# Patient Record
Sex: Male | Born: 1970 | Race: Black or African American | Hispanic: No | Marital: Single | Smoking: Former smoker
Health system: Southern US, Community
[De-identification: ages and names within clinical notes are randomized; demographics above are authoritative.]

## PROBLEM LIST (undated history)

## (undated) DIAGNOSIS — E119 Type 2 diabetes mellitus without complications: Secondary | ICD-10-CM

## (undated) DIAGNOSIS — K259 Gastric ulcer, unspecified as acute or chronic, without hemorrhage or perforation: Secondary | ICD-10-CM

## (undated) HISTORY — PX: APPENDECTOMY: SHX54

---

## 2002-10-03 ENCOUNTER — Emergency Department (HOSPITAL_COMMUNITY): Admission: EM | Admit: 2002-10-03 | Discharge: 2002-10-03 | Payer: Self-pay | Admitting: Emergency Medicine

## 2002-12-29 ENCOUNTER — Emergency Department (HOSPITAL_COMMUNITY): Admission: EM | Admit: 2002-12-29 | Discharge: 2002-12-29 | Payer: Self-pay | Admitting: Emergency Medicine

## 2003-03-08 ENCOUNTER — Emergency Department (HOSPITAL_COMMUNITY): Admission: EM | Admit: 2003-03-08 | Discharge: 2003-03-08 | Payer: Self-pay | Admitting: Emergency Medicine

## 2015-09-28 ENCOUNTER — Other Ambulatory Visit: Payer: Self-pay | Admitting: Family Medicine

## 2015-09-28 DIAGNOSIS — L720 Epidermal cyst: Secondary | ICD-10-CM

## 2015-10-02 ENCOUNTER — Ambulatory Visit
Admission: RE | Admit: 2015-10-02 | Discharge: 2015-10-02 | Disposition: A | Discharge status: Home / Self Care | Payer: Managed Care, Other (non HMO) | Source: Ambulatory Visit | Attending: Family Medicine | Admitting: Family Medicine

## 2015-10-02 ENCOUNTER — Other Ambulatory Visit: Payer: Self-pay | Admitting: Family Medicine

## 2015-10-02 DIAGNOSIS — L720 Epidermal cyst: Secondary | ICD-10-CM

## 2015-10-03 ENCOUNTER — Emergency Department (HOSPITAL_COMMUNITY)
Admission: EM | Admit: 2015-10-03 | Discharge: 2015-10-03 | Disposition: A | Discharge status: Home / Self Care | Payer: Managed Care, Other (non HMO) | Attending: Emergency Medicine | Admitting: Emergency Medicine

## 2015-10-03 ENCOUNTER — Encounter (HOSPITAL_COMMUNITY): Payer: Self-pay

## 2015-10-03 DIAGNOSIS — I889 Nonspecific lymphadenitis, unspecified: Secondary | ICD-10-CM | POA: Insufficient documentation

## 2015-10-03 DIAGNOSIS — Z87891 Personal history of nicotine dependence: Secondary | ICD-10-CM | POA: Diagnosis not present

## 2015-10-03 DIAGNOSIS — N509 Disorder of male genital organs, unspecified: Secondary | ICD-10-CM | POA: Insufficient documentation

## 2015-10-03 DIAGNOSIS — R591 Generalized enlarged lymph nodes: Secondary | ICD-10-CM | POA: Diagnosis present

## 2015-10-03 DIAGNOSIS — N5089 Other specified disorders of the male genital organs: Secondary | ICD-10-CM

## 2015-10-03 LAB — CBC WITH DIFFERENTIAL/PLATELET
Basophils Absolute: 0 10*3/uL (ref 0.0–0.1)
Basophils Relative: 0 %
EOS ABS: 0.3 10*3/uL (ref 0.0–0.7)
EOS PCT: 3 %
HCT: 44 % (ref 39.0–52.0)
Hemoglobin: 14.5 g/dL (ref 13.0–17.0)
LYMPHS PCT: 33 %
Lymphs Abs: 2.5 10*3/uL (ref 0.7–4.0)
MCH: 29.5 pg (ref 26.0–34.0)
MCHC: 33 g/dL (ref 30.0–36.0)
MCV: 89.4 fL (ref 78.0–100.0)
MONO ABS: 0.8 10*3/uL (ref 0.1–1.0)
Monocytes Relative: 11 %
Neutro Abs: 4.1 10*3/uL (ref 1.7–7.7)
Neutrophils Relative %: 53 %
PLATELETS: 256 10*3/uL (ref 150–400)
RBC: 4.92 MIL/uL (ref 4.22–5.81)
RDW: 12.2 % (ref 11.5–15.5)
WBC: 7.8 10*3/uL (ref 4.0–10.5)

## 2015-10-03 LAB — COMPREHENSIVE METABOLIC PANEL
ALT: 20 U/L (ref 17–63)
ANION GAP: 6 (ref 5–15)
AST: 22 U/L (ref 15–41)
Albumin: 4.2 g/dL (ref 3.5–5.0)
Alkaline Phosphatase: 38 U/L (ref 38–126)
BUN: 17 mg/dL (ref 6–20)
CHLORIDE: 104 mmol/L (ref 101–111)
CO2: 28 mmol/L (ref 22–32)
Calcium: 9.3 mg/dL (ref 8.9–10.3)
Creatinine, Ser: 1.29 mg/dL — ABNORMAL HIGH (ref 0.61–1.24)
Glucose, Bld: 98 mg/dL (ref 65–99)
POTASSIUM: 4 mmol/L (ref 3.5–5.1)
Sodium: 138 mmol/L (ref 135–145)
Total Bilirubin: 0.6 mg/dL (ref 0.3–1.2)
Total Protein: 7.4 g/dL (ref 6.5–8.1)

## 2015-10-03 MED ORDER — DOXYCYCLINE HYCLATE 100 MG PO TABS
100.0000 mg | ORAL_TABLET | Freq: Once | ORAL | Status: AC
  Administered 2015-10-03: 100 mg via ORAL
  Filled 2015-10-03: qty 1

## 2015-10-03 MED ORDER — HYDROCODONE-ACETAMINOPHEN 5-325 MG PO TABS: 2.0000 | ORAL_TABLET | ORAL | Status: DC | PRN

## 2015-10-03 MED ORDER — HYDROCODONE-ACETAMINOPHEN 5-325 MG PO TABS
1.0000 | ORAL_TABLET | Freq: Once | ORAL | Status: AC
  Administered 2015-10-03: 1 via ORAL
  Filled 2015-10-03: qty 1

## 2015-10-03 MED ORDER — DOXYCYCLINE HYCLATE 100 MG PO CAPS: 100.0000 mg | ORAL_CAPSULE | Freq: Two times a day (BID) | ORAL | Status: DC

## 2015-10-03 NOTE — Discharge Instructions (Signed)
It is recommended that you follow-up with a urologist to discuss any further treatment including possibility of removal of the lymph node if not improving.   Lymphadenopathy Lymphadenopathy refers to swollen or enlarged lymph glands, also called lymph nodes. Lymph glands are part of your body's defense (immune) system, which protects the body from infections, germs, and diseases. Lymph glands are found in many locations in your body, including the neck, underarm, and groin.  Many things can cause lymph glands to become enlarged. When your immune system responds to germs, such as viruses or bacteria, infection-fighting cells and fluid build up. This causes the glands to grow in size. Usually, this is not something to worry about. The swelling and any soreness often go away without treatment. However, swollen lymph glands can also be caused by a number of diseases. Your health care provider may do various tests to help determine the cause. If the cause of your swollen lymph glands cannot be found, it is important to monitor your condition to make sure the swelling goes away. HOME CARE INSTRUCTIONS Watch your condition for any changes. The following actions may help to lessen any discomfort you are feeling:  Get plenty of rest.  Take medicines only as directed by your health care provider. Your health care provider may recommend over-the-counter medicines for pain.  Apply moist heat compresses to the site of swollen lymph nodes as directed by your health care provider. This can help reduce any pain.  Check your lymph nodes daily for any changes.  Keep all follow-up visits as directed by your health care provider. This is important. SEEK MEDICAL CARE IF:  Your lymph nodes are still swollen after 2 weeks.  Your swelling increases or spreads to other areas.  Your lymph nodes are hard, seem fixed to the skin, or are growing rapidly.  Your skin over the lymph nodes is red and inflamed.  You have  a fever.  You have chills.  You have fatigue.  You develop a sore throat.  You have abdominal pain.  You have weight loss.  You have night sweats. SEEK IMMEDIATE MEDICAL CARE IF:  You notice fluid leaking from the area of the enlarged lymph node.  You have severe pain in any area of your body.  You have chest pain.  You have shortness of breath.   This information is not intended to replace advice given to you by your health care provider. Make sure you discuss any questions you have with your health care provider.   Document Released: 01/22/2008 Document Revised: 05/05/2014 Document Reviewed: 11/17/2013 Elsevier Interactive Patient Education [employer]2016 Elsevier Inc.

## 2015-10-03 NOTE — ED Notes (Signed)
Pt c/o increasing "bump" in L groin.  Pain score 10/10.  Pt reports taking OTC medication w/o relief.  Pt was recently seen by PCP for same and had an US completed yesterday.  Per chart review, US resulted necrotic lymph node in L groin.  Pt is unaware of result.

## 2015-10-03 NOTE — ED Provider Notes (Signed)
MSE was initiated and I personally evaluated the patient and placed orders (if any) at  5:16 PM on October 03, 2015.  The patient appears stable so that the remainder of the MSE may be completed by another provider.  Small area of left scrotal tenderness and swelling  Azalia BilisKevin Hennie Gosa, MD 10/03/15 [phone]

## 2015-10-03 NOTE — ED Provider Notes (Signed)
CSN: [id number]     Arrival date & time 10/03/15  1654 History   First MD Initiated Contact with Patient 10/03/15 1712     Chief Complaint  Patient presents with  . Lymphadenopathy      HPI  Patient presents for evaluation of scrotal pain. Had a swollen area in his left lower scrotum for the last year.  Coming more painful and swollen about a week ago. Outpatient ultrasound obtained today he presents here. No history of STDs. No history of genital sores. No testicular pain or swelling. No additional areas of lymphadenopathy in his inguinal region. No dysuria.  History reviewed. No pertinent past medical history. Past Surgical History  Procedure Laterality Date  . Appendectomy     History reviewed. No pertinent family history. Social History  Substance Use Topics  . Smoking status: Former Games developer  . Smokeless tobacco: None  . Alcohol Use: Yes    Review of Systems  Constitutional: Negative for fever, chills, diaphoresis, appetite change and fatigue.  HENT: Negative for mouth sores, sore throat and trouble swallowing.   Eyes: Negative for visual disturbance.  Respiratory: Negative for cough, chest tightness, shortness of breath and wheezing.   Cardiovascular: Negative for chest pain.  Gastrointestinal: Negative for nausea, vomiting, abdominal pain, diarrhea and abdominal distention.  Endocrine: Negative for polydipsia, polyphagia and polyuria.  Genitourinary: Positive for scrotal swelling. Negative for dysuria, frequency and hematuria.  Musculoskeletal: Negative for gait problem.  Skin: Negative for color change, pallor and rash.  Neurological: Negative for dizziness, syncope, light-headedness and headaches.  Hematological: Does not bruise/bleed easily.  Psychiatric/Behavioral: Negative for behavioral problems and confusion.      Allergies  Sulfa antibiotics  Home Medications   Prior to Admission medications   Medication Sig Start Date End Date Taking? Authorizing  Provider  acetaminophen (TYLENOL) 500 MG tablet Take 500 mg by mouth every 6 (six) hours as needed for moderate pain.   Yes Historical Provider, MD  doxycycline (VIBRAMYCIN) 100 MG capsule Take 1 capsule (100 mg total) by mouth 2 (two) times daily. 10/03/15   Rolland Porter, MD  HYDROcodone-acetaminophen (NORCO/VICODIN) 5-325 MG tablet Take 2 tablets by mouth every 4 (four) hours as needed. 10/03/15   Rolland Porter, MD   BP 126/79 mmHg  Pulse 53  Temp(Src) 98.5 F (36.9 C) (Oral)  Resp 16  SpO2 99% Physical Exam  Constitutional: He is oriented to person, place, and time. He appears well-developed and well-nourished. No distress.  HENT:  Head: Normocephalic.  Eyes: Conjunctivae are normal. Pupils are equal, round, and reactive to light. No scleral icterus.  Neck: Normal range of motion. Neck supple. No thyromegaly present.  Cardiovascular: Normal rate and regular rhythm.  Exam reveals no gallop and no friction rub.   No murmur heard. Pulmonary/Chest: Effort normal and breath sounds normal. No respiratory distress. He has no wheezes. He has no rales.  Abdominal: Soft. Bowel sounds are normal. He exhibits no distension. There is no tenderness. There is no rebound.  Genitourinary:     Musculoskeletal: Normal range of motion.  Neurological: He is alert and oriented to person, place, and time.  Skin: Skin is warm and dry. No rash noted.  Psychiatric: He has a normal mood and affect. His behavior is normal.    ED Course  Procedures (including critical care time) Labs Review Labs Reviewed  COMPREHENSIVE METABOLIC PANEL - Abnormal; Notable for the following:    Creatinine, Ser 1.29 (*)    All other components within normal limits  CBC WITH DIFFERENTIAL/PLATELET    Imaging Review [place] Pelvis Limited  10/02/2015  CLINICAL DATA:  Palpable area in the left inguinal region for 2 years, possibly increasing in size area EXAM: LIMITED ULTRASOUND OF PELVIS TECHNIQUE: Limited transabdominal ultrasound  examination of the pelvis was performed. COMPARISON:  None. FINDINGS: Ultrasound over the palpable area in the left groin was performed. Very superficial within the left groin, 4 mm below the skin surface, there is an inhomogeneous oval structure present followup 1.5 x 0.7 x 1.1 cm. The oval configuration may indicate a lymph node, although there is little if any blood flow within this lesion. A necrotic lymph node would be a consideration. This area does not appear to be typical of lipoma by ultrasound. Clinical followup is recommended. IMPRESSION: Possible necrotic lymph node within the left groin. This does not appear to represent a lipoma. Correlate and followup clinically. Electronically Signed   By: Dwyane DeePaul  Barry M.D.   On: 10/02/2015 13:36   [place] Abdomen Limited  10/02/2015  CLINICAL DATA:  Soft tissue palpable areas in the left flank EXAM: LIMITED ABDOMINAL ULTRASOUND COMPARISON:  None. FINDINGS: Ultrasound over the left flank and left mid lower back was performed at the site of the palpable abnormalities. The palpable abdomen in the left lower back is echogenic and oval most consistent with a lipoma by ultrasound. No internal blood flow is seen. The area within the left flank is more inhomogeneous but also somewhat echogenic. This most likely represents an atypical lipoma, with no internal blood flow, but clinical followup is recommended. IMPRESSION: 1. The area in the left mid lower back is most typical of lipoma by ultrasound. 2. The area in the left flank is inhomogeneous but somewhat echogenic, also probably right representing an atypical lipoma. Correlate clinically and recommend continued followup. Electronically Signed   By: Dwyane DeePaul  Barry M.D.   On: 10/02/2015 13:33   I have personally reviewed and evaluated these images and lab results as part of my medical decision-making.   EKG Interpretation None      MDM   Final diagnoses:  Scrotal mass  Lymphadenitis    Ultrasound shows 2 lipomas in  his back. Scrotal ultrasound shows normal testicle. Shows a poorly vascularized area consistent with probable degenerating lymph node. No fluid collection to suggest abscess. I've given him urology referral. Doxycycline 10 days. Advised that he follow up with urology to insure this requires no additional treatment for his labs are reassuring.    Rolland PorterMark Naiah Donahoe, MD 10/03/15 2048

## 2015-10-05 ENCOUNTER — Other Ambulatory Visit: Payer: Self-pay

## 2016-02-05 ENCOUNTER — Emergency Department (HOSPITAL_COMMUNITY)
Admission: EM | Admit: 2016-02-05 | Discharge: 2016-02-05 | Disposition: A | Discharge status: Home / Self Care | Payer: Managed Care, Other (non HMO) | Attending: Emergency Medicine | Admitting: Emergency Medicine

## 2016-02-05 ENCOUNTER — Encounter (HOSPITAL_COMMUNITY): Payer: Self-pay | Admitting: Emergency Medicine

## 2016-02-05 ENCOUNTER — Emergency Department (HOSPITAL_COMMUNITY): Payer: Managed Care, Other (non HMO)

## 2016-02-05 DIAGNOSIS — Z87891 Personal history of nicotine dependence: Secondary | ICD-10-CM | POA: Insufficient documentation

## 2016-02-05 DIAGNOSIS — E119 Type 2 diabetes mellitus without complications: Secondary | ICD-10-CM | POA: Insufficient documentation

## 2016-02-05 DIAGNOSIS — J209 Acute bronchitis, unspecified: Secondary | ICD-10-CM

## 2016-02-05 HISTORY — DX: Type 2 diabetes mellitus without complications: E11.9

## 2016-02-05 LAB — BASIC METABOLIC PANEL
ANION GAP: 10 (ref 5–15)
BUN: 13 mg/dL (ref 6–20)
CALCIUM: 9.1 mg/dL (ref 8.9–10.3)
CO2: 26 mmol/L (ref 22–32)
Chloride: 102 mmol/L (ref 101–111)
Creatinine, Ser: 1.51 mg/dL — ABNORMAL HIGH (ref 0.61–1.24)
GFR calc Af Amer: >60 mL/min (ref >60)
GFR, EST NON AFRICAN AMERICAN: 55 mL/min — AB (ref >60)
GLUCOSE: 94 mg/dL (ref 65–99)
Potassium: 3.2 mmol/L — ABNORMAL LOW (ref 3.5–5.1)
SODIUM: 138 mmol/L (ref 135–145)

## 2016-02-05 LAB — CBC
HCT: 43.9 % (ref 39.0–52.0)
HEMOGLOBIN: 14.4 g/dL (ref 13.0–17.0)
MCH: 30.1 pg (ref 26.0–34.0)
MCHC: 32.8 g/dL (ref 30.0–36.0)
MCV: 91.8 fL (ref 78.0–100.0)
Platelets: 229 10*3/uL (ref 150–400)
RBC: 4.78 MIL/uL (ref 4.22–5.81)
RDW: 12.3 % (ref 11.5–15.5)
WBC: 8.4 10*3/uL (ref 4.0–10.5)

## 2016-02-05 LAB — I-STAT TROPONIN, ED: TROPONIN I, POC: 0 ng/mL (ref 0.00–0.08)

## 2016-02-05 MED ORDER — AEROCHAMBER PLUS FLO-VU MEDIUM MISC
1.0000 | Freq: Once | Status: DC
  Filled 2016-02-05: qty 1

## 2016-02-05 MED ORDER — PROMETHAZINE-DM 6.25-15 MG/5ML PO SYRP: 5.0000 mL | ORAL_SOLUTION | Freq: Four times a day (QID) | ORAL | 0 refills | Status: DC | PRN

## 2016-02-05 MED ORDER — PREDNISONE 20 MG PO TABS
60.0000 mg | ORAL_TABLET | Freq: Once | ORAL | Status: AC
  Administered 2016-02-05: 60 mg via ORAL
  Filled 2016-02-05: qty 3

## 2016-02-05 MED ORDER — ALBUTEROL SULFATE (2.5 MG/3ML) 0.083% IN NEBU
5.0000 mg | INHALATION_SOLUTION | Freq: Once | RESPIRATORY_TRACT | Status: AC
  Administered 2016-02-05: 5 mg via RESPIRATORY_TRACT
  Filled 2016-02-05: qty 6

## 2016-02-05 MED ORDER — ALBUTEROL SULFATE HFA 108 (90 BASE) MCG/ACT IN AERS
2.0000 | INHALATION_SPRAY | Freq: Four times a day (QID) | RESPIRATORY_TRACT | Status: DC
Start: 2016-02-05 — End: 2016-02-05
  Administered 2016-02-05: 2 via RESPIRATORY_TRACT
  Filled 2016-02-05: qty 6.7

## 2016-02-05 MED ORDER — ALBUTEROL SULFATE (2.5 MG/3ML) 0.083% IN NEBU
5.0000 mg | INHALATION_SOLUTION | Freq: Once | RESPIRATORY_TRACT | Status: AC
Start: 2016-02-05 — End: 2016-02-05
  Administered 2016-02-05: 5 mg via RESPIRATORY_TRACT
  Filled 2016-02-05: qty 6

## 2016-02-05 MED ORDER — IPRATROPIUM BROMIDE 0.02 % IN SOLN
0.5000 mg | RESPIRATORY_TRACT | Status: AC
  Administered 2016-02-05: 0.5 mg via RESPIRATORY_TRACT
  Filled 2016-02-05: qty 2.5

## 2016-02-05 MED ORDER — SODIUM CHLORIDE 0.9 % IV BOLUS (SEPSIS)
1000.0000 mL | Freq: Once | INTRAVENOUS | Status: AC
  Administered 2016-02-05: 1000 mL via INTRAVENOUS

## 2016-02-05 MED ORDER — GUAIFENESIN ER 1200 MG PO TB12: 1.0000 | ORAL_TABLET | Freq: Two times a day (BID) | ORAL | 0 refills | Status: DC

## 2016-02-05 MED ORDER — PREDNISONE 50 MG PO TABS: 50.0000 mg | ORAL_TABLET | Freq: Every day | ORAL | 0 refills | Status: DC

## 2016-02-05 NOTE — Discharge Instructions (Signed)
Return here as needed.  Increase your fluid intake, rest as much possible.  Follow-up with a primary care doctor

## 2016-02-05 NOTE — ED Triage Notes (Signed)
Pt reports having a cold for the past two months that he hasn't been able to get rid of, despite taking OTC remedies. Yesterday, began experiencing chest tightness and incr'd dyspnea. Denies hx of asthma or other respiratory issues. Took partner's Proair with little to no relief. Denies fever or present N/V/D, though reports one episode of emesis prior.

## 2016-02-05 NOTE — ED Provider Notes (Signed)
[facility]EMER[facility]Y DEPT Provider Note   CSN: [id number] Arrival date & time: 02/05/16  0535     History   Chief Complaint Chief Complaint  Patient presents with  . Chest Pain  . Cough    HPI Peter Ballard is a 45 y.o. male.  HPI Patient presents to the emergency department with cough over the last month.  The patient states that he has also had some chest discomfort when he coughs.  Patient states that nothing seems to make the condition better or worse.  Patient states he is not a smokerThe patient denies  shortness of breath, headache,blurred vision, neck pain, fever, weakness, numbness, dizziness, anorexia, edema, abdominal pain, nausea, vomiting, diarrhea, rash, back pain, dysuria, hematemesis, bloody stool, near syncope, or syncope. Past Medical History:  Diagnosis Date  . Diabetes mellitus without complication (HCC)     There are no active problems to display for this patient.   Past Surgical History:  Procedure Laterality Date  . APPENDECTOMY         Home Medications    Prior to Admission medications   Medication Sig Start Date End Date Taking? Authorizing Provider  doxycycline (VIBRAMYCIN) 100 MG capsule Take 1 capsule (100 mg total) by mouth 2 (two) times daily. Patient not taking: Reported on 02/05/2016 10/03/15   Rolland Porter, MD  HYDROcodone-acetaminophen (NORCO/VICODIN) 5-325 MG tablet Take 2 tablets by mouth every 4 (four) hours as needed. Patient not taking: Reported on 02/05/2016 10/03/15   Rolland Porter, MD    Family History History reviewed. No pertinent family history.  Social History Social History  Substance Use Topics  . Smoking status: Former Games developer  . Smokeless tobacco: Never Used  . Alcohol use Yes     Allergies   Sulfa antibiotics   Review of Systems Review of Systems All other systems negative except as documented in the HPI. All pertinent positives and negatives as reviewed in the HPI.  Physical Exam Updated Vital Signs BP 124/81    Pulse 76   Temp 97.9 F (36.6 C) (Oral)   Resp 11   Ht 6' (1.829 m)   Wt 88.5 kg   SpO2 95%   BMI 26.45 kg/m   Physical Exam  Constitutional: He is oriented to person, place, and time. He appears well-developed and well-nourished. No distress.  HENT:  Head: Normocephalic and atraumatic.  Mouth/Throat: Oropharynx is clear and moist.  Eyes: Pupils are equal, round, and reactive to light.  Neck: Normal range of motion. Neck supple.  Cardiovascular: Normal rate, regular rhythm and normal heart sounds.  Exam reveals no gallop and no friction rub.   No murmur heard. Pulmonary/Chest: Effort normal. No respiratory distress. He has wheezes. He has no rales.  Abdominal: Soft. Bowel sounds are normal. He exhibits no distension. There is no tenderness.  Musculoskeletal: He exhibits no edema.  Neurological: He is alert and oriented to person, place, and time. He exhibits normal muscle tone. Coordination normal.  Skin: Skin is warm and dry. Capillary refill takes less than 2 seconds. No rash noted. No erythema.  Psychiatric: He has a normal mood and affect. His behavior is normal.  Nursing note and vitals reviewed.    ED Treatments / Results  Labs (all labs ordered are listed, but only abnormal results are displayed) Labs Reviewed  BASIC METABOLIC PANEL - Abnormal; Notable for the following:       Result Value   Potassium 3.2 (*)    Creatinine, Ser 1.51 (*)    GFR calc  non Af Amer 55 (*)    All other components within normal limits  CBC  I-STAT TROPOININ, ED    EKG  EKG Interpretation  Date/Time:  Tuesday February 05 2016 05:41:36 EDT Ventricular Rate:  62 PR Interval:    QRS Duration: 95 QT Interval:  390 QTC Calculation: 396 R Axis:   79 Text Interpretation:  Sinus rhythm Interpretation limited secondary to artifact No old tracing to compare Confirmed by Erroll Lunani, Adeleke Ayokunle [phone]([id number]) on 02/05/2016 5:55:49 AM Also confirmed by Erroll Lunani, Adeleke Ayokunle [phone]), editor [place]   CCT, BEVERLY ([id number])  on 02/05/2016 7:19:02 AM       Radiology Dg Chest 2 View  Result Date: 02/05/2016 CLINICAL DATA:  Anterior chest discomfort associated with shortness of breath and productive cough. Patient reports symptoms for 1 month. History of diabetes and former smoker. EXAM: CHEST  2 VIEW COMPARISON:  None in PACs FINDINGS: The lungs are adequately inflated and clear. The heart and pulmonary vascularity are normal. The mediastinum is normal in width. There is no pleural effusion. The bony thorax is unremarkable. IMPRESSION: There is no active cardiopulmonary disease. Electronically Signed   By: David  Swazil[place]Jordan M.D.   On: 02/05/2016 07:12    Procedures Procedures (including critical care time)  Medications Ordered in ED Medications  albuterol (PROVENTIL) (2.5 MG/3ML) 0.083% nebulizer solution 5 mg (5 mg Nebulization Given 02/05/16 0613)  albuterol (PROVENTIL) (2.5 MG/3ML) 0.083% nebulizer solution 5 mg (5 mg Nebulization Given 02/05/16 0717)  ipratropium (ATROVENT) nebulizer solution 0.5 mg (0.5 mg Nebulization Given 02/05/16 0717)  predniSONE (DELTASONE) tablet 60 mg (60 mg Oral Given 02/05/16 0855)  sodium chloride 0.9 % bolus 1,000 mL (1,000 mLs Intravenous New Bag/Given 02/05/16 0900)     Initial Impression / Assessment and Plan / ED Course  I have reviewed the triage vital signs and the nursing notes.  Pertinent labs & imaging results that were available during my care of the patient were reviewed by me and considered in my medical decision making (see chart for details).  Clinical Course  Patient will be treated for bronchitis.  Told to return here as needed.  Patient is feeling better following several neb treatments and steroids.  Patient is advised to follow-up with her primary care doctor, told to increase his fluid intake and rest as much as possible  Final Clinical Impressions(s) / ED Diagnoses   Final diagnoses:  None    New Prescriptions New Prescriptions    No medications on file     Charlestine NightChristopher Blue Ruggerio, PA-C 02/07/16 1711    Loren Raceravid Yelverton, MD 02/10/16 1021

## 2016-02-05 NOTE — ED Notes (Signed)
Patient transported to X-ray 

## 2016-09-19 ENCOUNTER — Emergency Department (HOSPITAL_COMMUNITY)
Admission: EM | Admit: 2016-09-19 | Discharge: 2016-09-19 | Disposition: A | Discharge status: Home / Self Care | Payer: 59 | Attending: Emergency Medicine | Admitting: Emergency Medicine

## 2016-09-19 ENCOUNTER — Encounter (HOSPITAL_COMMUNITY): Payer: Self-pay | Admitting: *Deleted

## 2016-09-19 DIAGNOSIS — Z87891 Personal history of nicotine dependence: Secondary | ICD-10-CM | POA: Diagnosis not present

## 2016-09-19 DIAGNOSIS — E119 Type 2 diabetes mellitus without complications: Secondary | ICD-10-CM | POA: Insufficient documentation

## 2016-09-19 DIAGNOSIS — R197 Diarrhea, unspecified: Secondary | ICD-10-CM | POA: Diagnosis not present

## 2016-09-19 DIAGNOSIS — Z79899 Other long term (current) drug therapy: Secondary | ICD-10-CM | POA: Diagnosis not present

## 2016-09-19 DIAGNOSIS — R112 Nausea with vomiting, unspecified: Secondary | ICD-10-CM | POA: Diagnosis not present

## 2016-09-19 DIAGNOSIS — R109 Unspecified abdominal pain: Secondary | ICD-10-CM | POA: Diagnosis present

## 2016-09-19 LAB — URINALYSIS, ROUTINE W REFLEX MICROSCOPIC
BILIRUBIN URINE: NEGATIVE
GLUCOSE, UA: NEGATIVE mg/dL
Hgb urine dipstick: NEGATIVE
KETONES UR: NEGATIVE mg/dL
Leukocytes, UA: NEGATIVE
NITRITE: NEGATIVE
PH: 6 (ref 5.0–8.0)
Protein, ur: NEGATIVE mg/dL
Specific Gravity, Urine: 1.005 (ref 1.005–1.030)

## 2016-09-19 LAB — COMPREHENSIVE METABOLIC PANEL
ALBUMIN: 4.2 g/dL (ref 3.5–5.0)
ALT: 17 U/L (ref 17–63)
AST: 25 U/L (ref 15–41)
Alkaline Phosphatase: 32 U/L — ABNORMAL LOW (ref 38–126)
Anion gap: 10 (ref 5–15)
BILIRUBIN TOTAL: 1.1 mg/dL (ref 0.3–1.2)
BUN: 13 mg/dL (ref 6–20)
CO2: 24 mmol/L (ref 22–32)
CREATININE: 1.5 mg/dL — AB (ref 0.61–1.24)
Calcium: 9.5 mg/dL (ref 8.9–10.3)
Chloride: 104 mmol/L (ref 101–111)
GFR calc Af Amer: >60 mL/min (ref >60)
GFR calc non Af Amer: 55 mL/min — ABNORMAL LOW (ref >60)
GLUCOSE: 106 mg/dL — AB (ref 65–99)
POTASSIUM: 3.5 mmol/L (ref 3.5–5.1)
Sodium: 138 mmol/L (ref 135–145)
TOTAL PROTEIN: 7 g/dL (ref 6.5–8.1)

## 2016-09-19 LAB — CBC
HEMATOCRIT: 43.4 % (ref 39.0–52.0)
Hemoglobin: 14.7 g/dL (ref 13.0–17.0)
MCH: 30.1 pg (ref 26.0–34.0)
MCHC: 33.9 g/dL (ref 30.0–36.0)
MCV: 88.9 fL (ref 78.0–100.0)
Platelets: 242 10*3/uL (ref 150–400)
RBC: 4.88 MIL/uL (ref 4.22–5.81)
RDW: 12 % (ref 11.5–15.5)
WBC: 8.3 10*3/uL (ref 4.0–10.5)

## 2016-09-19 LAB — LIPASE, BLOOD: Lipase: 26 U/L (ref 11–51)

## 2016-09-19 LAB — CBG MONITORING, ED
GLUCOSE-CAPILLARY: 98 mg/dL (ref 65–99)
Glucose-Capillary: 95 mg/dL (ref 65–99)

## 2016-09-19 MED ORDER — METOCLOPRAMIDE HCL 5 MG/ML IJ SOLN
10.0000 mg | Freq: Once | INTRAMUSCULAR | Status: AC
  Administered 2016-09-19: 10 mg via INTRAVENOUS
  Filled 2016-09-19: qty 2

## 2016-09-19 MED ORDER — SODIUM CHLORIDE 0.9 % IV BOLUS (SEPSIS)
1000.0000 mL | Freq: Once | INTRAVENOUS | Status: AC
Start: 2016-09-19 — End: 2016-09-19
  Administered 2016-09-19: 1000 mL via INTRAVENOUS

## 2016-09-19 MED ORDER — SODIUM CHLORIDE 0.9 % IV BOLUS (SEPSIS): 1000.0000 mL | Freq: Once | INTRAVENOUS | Status: DC

## 2016-09-19 MED ORDER — ONDANSETRON HCL 4 MG/2ML IJ SOLN
4.0000 mg | Freq: Once | INTRAMUSCULAR | Status: AC
  Administered 2016-09-19: 4 mg via INTRAVENOUS
  Filled 2016-09-19: qty 2

## 2016-09-19 MED ORDER — ONDANSETRON 4 MG PO TBDP: ORAL_TABLET | ORAL | 0 refills | Status: DC

## 2016-09-19 NOTE — ED Notes (Signed)
IV was accidentally removed by patient when he was turning in bed. Site had minimal bleeding. Controlled at this time

## 2016-09-19 NOTE — ED Triage Notes (Signed)
Pt states abdominal pain, diarrhea and vomiting for several days.  Pt is a diet controlled diabetic and states he has been more thirsty lately and has been urinating more.

## 2016-09-19 NOTE — ED Notes (Signed)
Pt did well with PO challenge and states he feels much better. Dr. Fredderick PhenixBelfi informed.

## 2016-09-19 NOTE — ED Notes (Signed)
Pt is in stable condition upon d/c and ambulates from ED. 

## 2016-09-19 NOTE — ED Provider Notes (Signed)
[facility] Provider Note   CSN: [id number] Arrival date & time: 09/19/16  [id number]     History   Chief Complaint Chief Complaint  Patient presents with  . Abdominal Pain    HPI Peter Ballard is a 46 y.o. male.  Patient is a 46 year old male with a history of diet controlled diabetes who presents with nausea vomiting and diarrhea. He states he hasn't had an appetite for several days but over the last 2 days he's had nausea vomiting and diarrhea. His emesis is nonbloody and nonbilious. His diarrhea as watery. He denies any recent antibiotic use. No recent travel. He ate some shrimp and steak just before all the symptoms started. He has some diffuse abdominal cramping that is intermittent. He currently denies any abdominal pain. Had some subjective fevers. No urinary symptoms other than he does have some urinary frequency.      Past Medical History:  Diagnosis Date  . Diabetes mellitus without complication (HCC)     There are no active problems to display for this patient.   Past Surgical History:  Procedure Laterality Date  . APPENDECTOMY         Home Medications    Prior to Admission medications   Medication Sig Start Date End Date Taking? Authorizing Provider  ibuprofen (ADVIL,MOTRIN) 200 MG tablet Take 200 mg by mouth every 6 (six) hours as needed for headache.   Yes [provider]  doxycycline (VIBRAMYCIN) 100 MG capsule Take 1 capsule (100 mg total) by mouth 2 (two) times daily. Patient not taking: Reported on 09/19/2016 10/03/15   Rolland PorterJames, Mark, MD  Guaifenesin 1200 MG TB12 Take 1 tablet (1,200 mg total) by mouth 2 (two) times daily. Patient not taking: Reported on 09/19/2016 02/05/16   Charlestine NightLawyer, Christopher, PA-C  HYDROcodone-acetaminophen (NORCO/VICODIN) 5-325 MG tablet Take 2 tablets by mouth every 4 (four) hours as needed. Patient not taking: Reported on 09/19/2016 10/03/15   Rolland PorterJames, Mark, MD  ondansetron (ZOFRAN ODT) 4 MG disintegrating tablet 4mg  ODT  q4 hours prn nausea/vomit 09/19/16   Rolan BuccoBelfi, Azir Muzyka, MD  predniSONE (DELTASONE) 50 MG tablet Take 1 tablet (50 mg total) by mouth daily. Patient not taking: Reported on 09/19/2016 02/05/16   Charlestine NightLawyer, Christopher, PA-C  promethazine-dextromethorphan (PROMETHAZINE-DM) 6.25-15 MG/5ML syrup Take 5 mLs by mouth 4 (four) times daily as needed for cough. Patient not taking: Reported on 09/19/2016 02/05/16   Charlestine NightLawyer, Christopher, PA-C    Family History No family history on file.  Social History Social History  Substance Use Topics  . Smoking status: Former Games developermoker  . Smokeless tobacco: Never Used  . Alcohol use Yes     Allergies   Sulfa antibiotics   Review of Systems Review of Systems  Constitutional: Negative for chills, diaphoresis, fatigue and fever.  HENT: Negative for congestion, rhinorrhea and sneezing.   Eyes: Negative.   Respiratory: Negative for cough, chest tightness and shortness of breath.   Cardiovascular: Negative for chest pain and leg swelling.  Gastrointestinal: Positive for abdominal pain, diarrhea, nausea and vomiting. Negative for blood in stool.  Genitourinary: Negative for difficulty urinating, flank pain, frequency and hematuria.  Musculoskeletal: Negative for arthralgias and back pain.  Skin: Negative for rash.  Neurological: Negative for dizziness, speech difficulty, weakness, numbness and headaches.     Physical Exam Updated Vital Signs BP 113/76   Pulse 67   Temp 98.6 F (37 C) (Oral)   Resp 18   Ht 6' (1.829 m)   Wt 82.1 kg (181 lb)  SpO2 97%   BMI 24.55 kg/m   Physical Exam  Constitutional: He is oriented to person, place, and time. He appears well-developed and well-nourished.  HENT:  Head: Normocephalic and atraumatic.  Eyes: Pupils are equal, round, and reactive to light.  Neck: Normal range of motion. Neck supple.  Cardiovascular: Normal rate, regular rhythm and normal heart sounds.   Pulmonary/Chest: Effort normal and breath sounds  normal. No respiratory distress. He has no wheezes. He has no rales. He exhibits no tenderness.  Abdominal: Soft. Bowel sounds are normal. There is tenderness (Mild diffuse tenderness). There is no rebound and no guarding.  Musculoskeletal: Normal range of motion. He exhibits no edema.  Lymphadenopathy:    He has no cervical adenopathy.  Neurological: He is alert and oriented to person, place, and time.  Skin: Skin is warm and dry. No rash noted.  Psychiatric: He has a normal mood and affect.     ED Treatments / Results  Labs (all labs ordered are listed, but only abnormal results are displayed) Labs Reviewed  COMPREHENSIVE METABOLIC PANEL - Abnormal; Notable for the following:       Result Value   Glucose, Bld 106 (*)    Creatinine, Ser 1.50 (*)    Alkaline Phosphatase 32 (*)    GFR calc non Af Amer 55 (*)    All other components within normal limits  URINALYSIS, ROUTINE W REFLEX MICROSCOPIC - Abnormal; Notable for the following:    Color, Urine STRAW (*)    All other components within normal limits  LIPASE, BLOOD  CBC  CBG MONITORING, ED  CBG MONITORING, ED    EKG  EKG Interpretation None       Radiology No results found.  Procedures Procedures (including critical care time)  Medications Ordered in ED Medications  sodium chloride 0.9 % bolus 1,000 mL (0 mLs Intravenous Stopped 09/19/16 1038)  ondansetron (ZOFRAN) injection 4 mg (4 mg Intravenous Given 09/19/16 0939)  metoCLOPramide (REGLAN) injection 10 mg (10 mg Intravenous Given 09/19/16 1013)     Initial Impression / Assessment and Plan / ED Course  I have reviewed the triage vital signs and the nursing notes.  Pertinent labs & imaging results that were available during my care of the patient were reviewed by me and considered in my medical decision making (see chart for details).     Patient presents with nausea vomiting diarrhea. He had some mild abdominal tenderness on initial exam but after IV fluids  and antiemetics in the ED, he is feeling much better and has no ongoing abdominal pain. His repeat abdominal exam is completely benign. He's tolerating oral fluids. This is likely viral in nature. I did advise him to maintain a clear liquid diet for the next 24 hours and slowly progress after that. His labs are non-concerning. His creatinine is mildly elevated but similar to his last prior values. I did advise him of this and that he needs to have this followed by his PCP. Her given.  Final Clinical Impressions(s) / ED Diagnoses   Final diagnoses:  Nausea vomiting and diarrhea    New Prescriptions New Prescriptions   ONDANSETRON (ZOFRAN ODT) 4 MG DISINTEGRATING TABLET    4mg  ODT q4 hours prn nausea/vomit     Rolan Bucco, MD 09/19/16 1203

## 2016-10-19 ENCOUNTER — Encounter (HOSPITAL_COMMUNITY): Payer: Self-pay | Admitting: Emergency Medicine

## 2016-10-19 ENCOUNTER — Emergency Department (HOSPITAL_COMMUNITY): Payer: 59

## 2016-10-19 ENCOUNTER — Emergency Department (HOSPITAL_COMMUNITY)
Admission: EM | Admit: 2016-10-19 | Discharge: 2016-10-19 | Disposition: A | Discharge status: Home / Self Care | Payer: 59 | Attending: Emergency Medicine | Admitting: Emergency Medicine

## 2016-10-19 DIAGNOSIS — R0602 Shortness of breath: Secondary | ICD-10-CM | POA: Diagnosis present

## 2016-10-19 DIAGNOSIS — K279 Peptic ulcer, site unspecified, unspecified as acute or chronic, without hemorrhage or perforation: Secondary | ICD-10-CM | POA: Diagnosis not present

## 2016-10-19 DIAGNOSIS — E119 Type 2 diabetes mellitus without complications: Secondary | ICD-10-CM | POA: Diagnosis not present

## 2016-10-19 DIAGNOSIS — Z87891 Personal history of nicotine dependence: Secondary | ICD-10-CM | POA: Diagnosis not present

## 2016-10-19 LAB — COMPREHENSIVE METABOLIC PANEL
ALBUMIN: 4.3 g/dL (ref 3.5–5.0)
ALT: 16 U/L — ABNORMAL LOW (ref 17–63)
AST: 23 U/L (ref 15–41)
Alkaline Phosphatase: 33 U/L — ABNORMAL LOW (ref 38–126)
Anion gap: 7 (ref 5–15)
BUN: 8 mg/dL (ref 6–20)
CHLORIDE: 107 mmol/L (ref 101–111)
CO2: 25 mmol/L (ref 22–32)
Calcium: 9.4 mg/dL (ref 8.9–10.3)
Creatinine, Ser: 1.27 mg/dL — ABNORMAL HIGH (ref 0.61–1.24)
GFR calc Af Amer: >60 mL/min (ref >60)
GLUCOSE: 112 mg/dL — AB (ref 65–99)
Potassium: 3.8 mmol/L (ref 3.5–5.1)
SODIUM: 139 mmol/L (ref 135–145)
Total Bilirubin: 1.1 mg/dL (ref 0.3–1.2)
Total Protein: 7.2 g/dL (ref 6.5–8.1)

## 2016-10-19 LAB — URINALYSIS, ROUTINE W REFLEX MICROSCOPIC
BACTERIA UA: NONE SEEN
Bilirubin Urine: NEGATIVE
Glucose, UA: NEGATIVE mg/dL
KETONES UR: NEGATIVE mg/dL
Nitrite: NEGATIVE
PH: 5 (ref 5.0–8.0)
PROTEIN: NEGATIVE mg/dL
SQUAMOUS EPITHELIAL / LPF: NONE SEEN
Specific Gravity, Urine: 1.035 — ABNORMAL HIGH (ref 1.005–1.030)

## 2016-10-19 LAB — CBC
HCT: 44.7 % (ref 39.0–52.0)
Hemoglobin: 15.6 g/dL (ref 13.0–17.0)
MCH: 30.8 pg (ref 26.0–34.0)
MCHC: 34.9 g/dL (ref 30.0–36.0)
MCV: 88.2 fL (ref 78.0–100.0)
Platelets: 242 10*3/uL (ref 150–400)
RBC: 5.07 MIL/uL (ref 4.22–5.81)
RDW: 12 % (ref 11.5–15.5)
WBC: 6.7 10*3/uL (ref 4.0–10.5)

## 2016-10-19 LAB — POCT I-STAT TROPONIN I: Troponin i, poc: 0 ng/mL (ref 0.00–0.08)

## 2016-10-19 LAB — LIPASE, BLOOD: LIPASE: 50 U/L (ref 11–51)

## 2016-10-19 MED ORDER — IOPAMIDOL (ISOVUE-300) INJECTION 61%
100.0000 mL | Freq: Once | INTRAVENOUS | Status: AC | PRN
  Administered 2016-10-19: 100 mL via INTRAVENOUS

## 2016-10-19 MED ORDER — ALBUTEROL SULFATE (2.5 MG/3ML) 0.083% IN NEBU: 5.0000 mg | INHALATION_SOLUTION | Freq: Once | RESPIRATORY_TRACT | Status: DC

## 2016-10-19 MED ORDER — ONDANSETRON HCL 4 MG PO TABS: 4.0000 mg | ORAL_TABLET | Freq: Three times a day (TID) | ORAL | 0 refills | Status: DC | PRN

## 2016-10-19 MED ORDER — PANTOPRAZOLE SODIUM 20 MG PO TBEC: 20.0000 mg | DELAYED_RELEASE_TABLET | Freq: Every day | ORAL | 0 refills | Status: DC

## 2016-10-19 MED ORDER — PANTOPRAZOLE SODIUM 40 MG IV SOLR
40.0000 mg | Freq: Once | INTRAVENOUS | Status: AC
  Administered 2016-10-19: 40 mg via INTRAVENOUS
  Filled 2016-10-19: qty 40

## 2016-10-19 MED ORDER — IOPAMIDOL (ISOVUE-300) INJECTION 61%
INTRAVENOUS | Status: AC
  Filled 2016-10-19: qty 100

## 2016-10-19 MED ORDER — SUCRALFATE 1 G PO TABS
1.0000 g | ORAL_TABLET | Freq: Once | ORAL | Status: AC
  Administered 2016-10-19: 1 g via ORAL
  Filled 2016-10-19: qty 1

## 2016-10-19 MED ORDER — ONDANSETRON HCL 4 MG/2ML IJ SOLN
4.0000 mg | Freq: Once | INTRAMUSCULAR | Status: AC
  Administered 2016-10-19: 4 mg via INTRAVENOUS
  Filled 2016-10-19: qty 2

## 2016-10-19 MED ORDER — SUCRALFATE 1 G PO TABS: 1.0000 g | ORAL_TABLET | Freq: Three times a day (TID) | ORAL | 0 refills | Status: DC

## 2016-10-19 MED ORDER — SODIUM CHLORIDE 0.9 % IV BOLUS (SEPSIS)
1000.0000 mL | Freq: Once | INTRAVENOUS | Status: AC
  Administered 2016-10-19: 1000 mL via INTRAVENOUS

## 2016-10-19 NOTE — Discharge Instructions (Signed)
Read the information below.  Use the prescribed medication as directed.  Please discuss all new medications with your pharmacist.  You may return to the Emergency Department at any time for worsening condition or any new symptoms that concern you.   If you develop high fevers, worsening abdominal pain, uncontrolled vomiting, or are unable to tolerate fluids by mouth, return to the ER for a recheck.  ° °

## 2016-10-19 NOTE — ED Notes (Signed)
Pt attempting to provide urine specimen

## 2016-10-19 NOTE — ED Notes (Signed)
Pt able to tolerate PO fluids without difficulty.  

## 2016-10-19 NOTE — ED Triage Notes (Signed)
Pt reports SOB, emesis, and cough for the past 4 days. Accompanied by diarrhea and CP. Pt speaking in full sentences with no apparent difficulty.
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
## 2016-10-19 NOTE — ED Notes (Signed)
Pt given urinal and made aware of need for urine specimen 

## 2017-04-22 ENCOUNTER — Emergency Department (HOSPITAL_COMMUNITY): Payer: No Typology Code available for payment source

## 2017-04-22 ENCOUNTER — Encounter (HOSPITAL_COMMUNITY): Payer: Self-pay | Admitting: Emergency Medicine

## 2017-04-22 ENCOUNTER — Emergency Department (HOSPITAL_COMMUNITY)
Admission: EM | Admit: 2017-04-22 | Discharge: 2017-04-23 | Disposition: A | Discharge status: Home / Self Care | Payer: No Typology Code available for payment source | Attending: Emergency Medicine | Admitting: Emergency Medicine

## 2017-04-22 DIAGNOSIS — Y999 Unspecified external cause status: Secondary | ICD-10-CM | POA: Insufficient documentation

## 2017-04-22 DIAGNOSIS — F0781 Postconcussional syndrome: Secondary | ICD-10-CM | POA: Diagnosis not present

## 2017-04-22 DIAGNOSIS — Y9389 Activity, other specified: Secondary | ICD-10-CM | POA: Diagnosis not present

## 2017-04-22 DIAGNOSIS — Y92410 Unspecified street and highway as the place of occurrence of the external cause: Secondary | ICD-10-CM | POA: Insufficient documentation

## 2017-04-22 DIAGNOSIS — E119 Type 2 diabetes mellitus without complications: Secondary | ICD-10-CM | POA: Diagnosis not present

## 2017-04-22 DIAGNOSIS — M542 Cervicalgia: Secondary | ICD-10-CM | POA: Diagnosis not present

## 2017-04-22 DIAGNOSIS — R51 Headache: Secondary | ICD-10-CM | POA: Diagnosis not present

## 2017-04-22 DIAGNOSIS — Z7984 Long term (current) use of oral hypoglycemic drugs: Secondary | ICD-10-CM | POA: Insufficient documentation

## 2017-04-22 DIAGNOSIS — Z87891 Personal history of nicotine dependence: Secondary | ICD-10-CM | POA: Insufficient documentation

## 2017-04-22 DIAGNOSIS — S199XXA Unspecified injury of neck, initial encounter: Secondary | ICD-10-CM | POA: Diagnosis present

## 2017-04-22 LAB — BASIC METABOLIC PANEL
ANION GAP: 8 (ref 5–15)
BUN: 19 mg/dL (ref 6–20)
CHLORIDE: 104 mmol/L (ref 101–111)
CO2: 25 mmol/L (ref 22–32)
Calcium: 9 mg/dL (ref 8.9–10.3)
Creatinine, Ser: 1.35 mg/dL — ABNORMAL HIGH (ref 0.61–1.24)
Glucose, Bld: 92 mg/dL (ref 65–99)
POTASSIUM: 3.5 mmol/L (ref 3.5–5.1)
SODIUM: 137 mmol/L (ref 135–145)

## 2017-04-22 LAB — CBC
HEMATOCRIT: 40.5 % (ref 39.0–52.0)
HEMOGLOBIN: 13.8 g/dL (ref 13.0–17.0)
MCH: 30.5 pg (ref 26.0–34.0)
MCHC: 34.1 g/dL (ref 30.0–36.0)
MCV: 89.6 fL (ref 78.0–100.0)
Platelets: 226 10*3/uL (ref 150–400)
RBC: 4.52 MIL/uL (ref 4.22–5.81)
RDW: 12 % (ref 11.5–15.5)
WBC: 8.3 10*3/uL (ref 4.0–10.5)

## 2017-04-22 LAB — CBG MONITORING, ED: Glucose-Capillary: 94 mg/dL (ref 65–99)

## 2017-04-22 MED ORDER — IBUPROFEN 200 MG PO TABS
400.0000 mg | ORAL_TABLET | Freq: Once | ORAL | Status: AC | PRN
  Administered 2017-04-22: 400 mg via ORAL
  Filled 2017-04-22: qty 2
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
## 2017-04-23 MED ORDER — IBUPROFEN 600 MG PO TABS: 600.0000 mg | ORAL_TABLET | Freq: Four times a day (QID) | ORAL | 0 refills | Status: AC | PRN
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
## 2018-06-27 IMAGING — CT CT ABD-PELV W/ CM
2 of 5 series · 16 of 46 positions shown, 18 images · IV contrast (ISOVUE)
Comparison: None.

CLINICAL DATA: 20 pound weight loss over the past 3 months. Nausea,
vomiting and diarrhea.

EXAM:
CT ABDOMEN AND PELVIS WITH CONTRAST
TECHNIQUE: Multidetector CT imaging of the abdomen and pelvis was performed
using the standard protocol following bolus administration of
intravenous contrast.
CONTRAST:  100mL CA6FHE-PTT IOPAMIDOL (CA6FHE-PTT) INJECTION 61%

[Series 2: abd/pel with · axial · 0.85mm/px · z∈[-853,-428]mm · 13 of 101 slices shown, 15 images]
[im 8/101  soft-tissue]
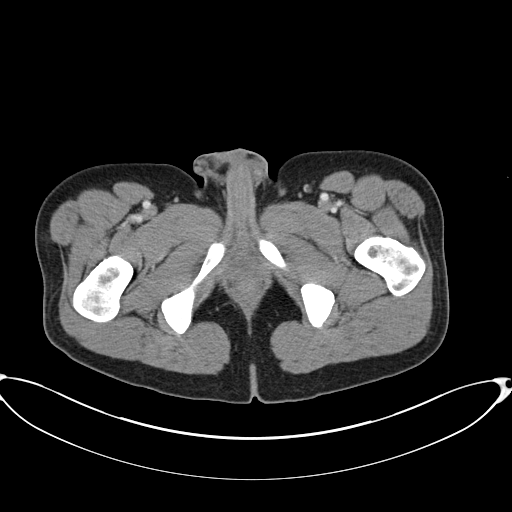
[im 8/101  bone]
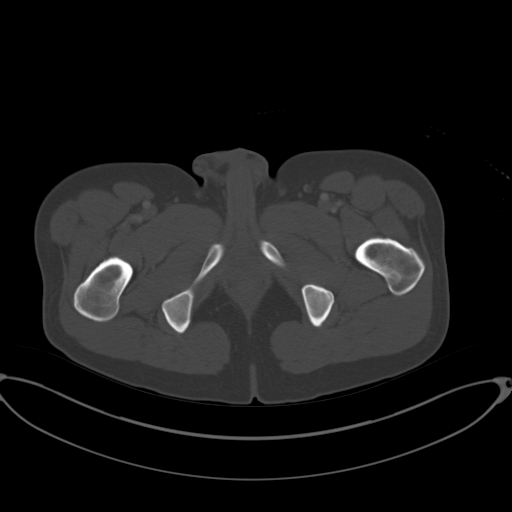
[im 15/101  soft-tissue]
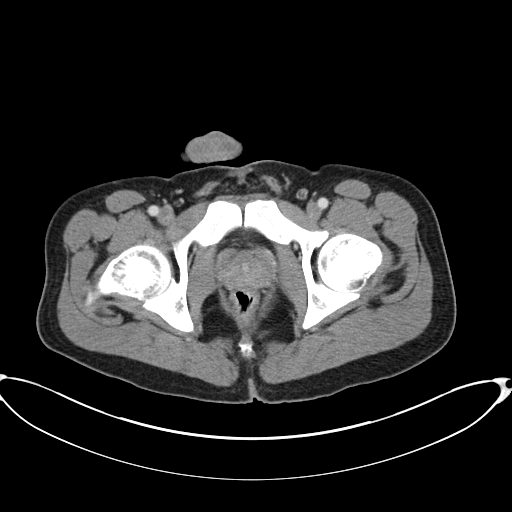
[im 22/101  soft-tissue]
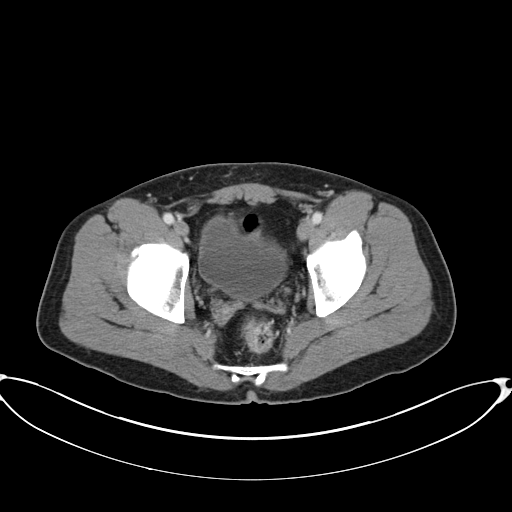
[im 29/101  soft-tissue]
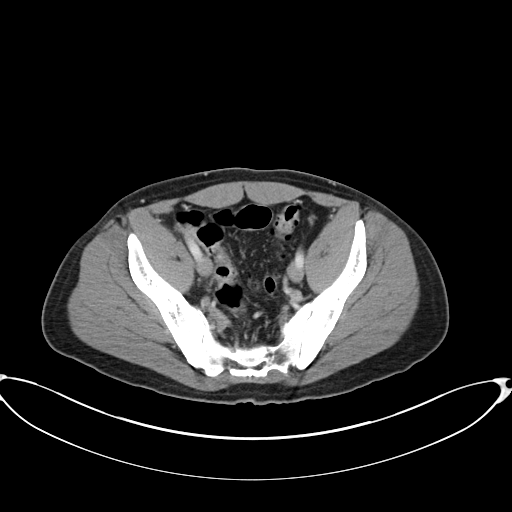
[im 36/101  soft-tissue]
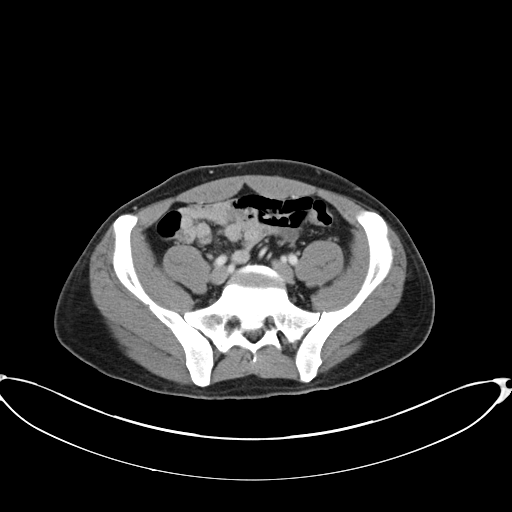
[im 43/101  soft-tissue]
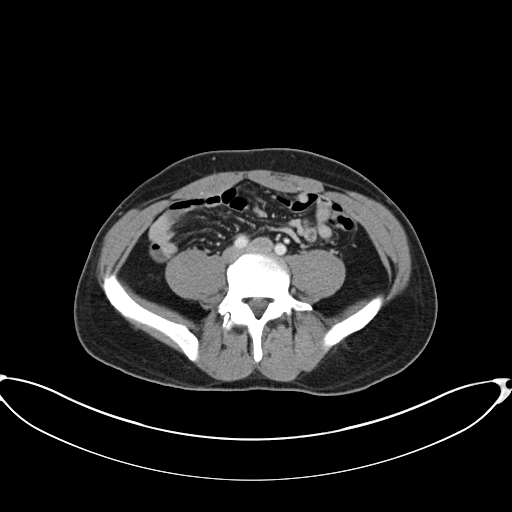
[im 51/101  soft-tissue]
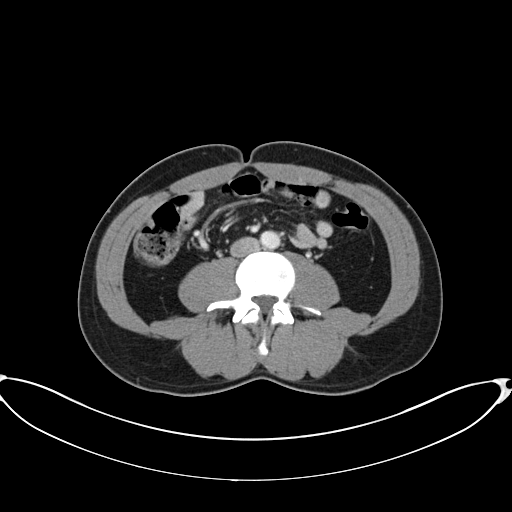
[im 58/101  soft-tissue]
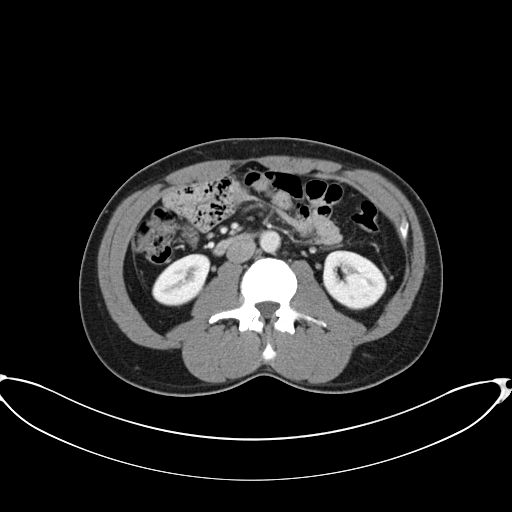
[im 65/101  soft-tissue]
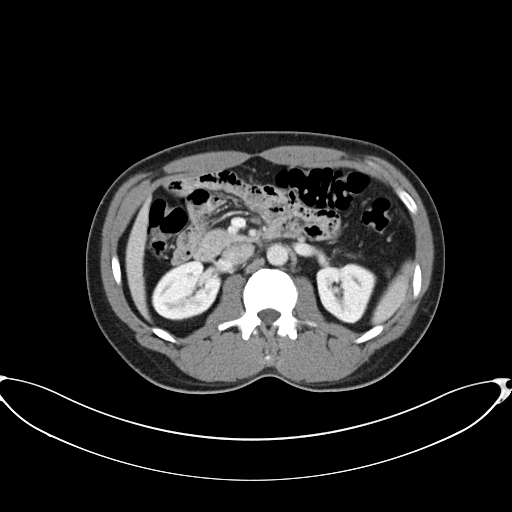
[im 65/101  bone]
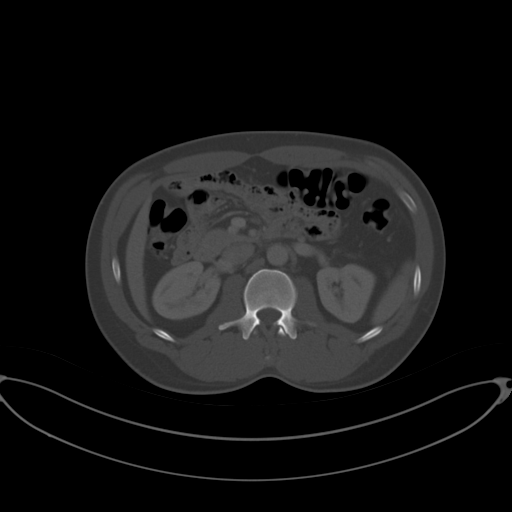
[im 72/101  soft-tissue]
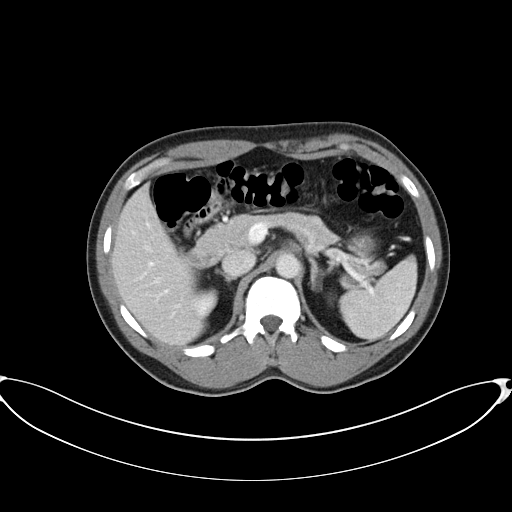
[im 79/101  soft-tissue]
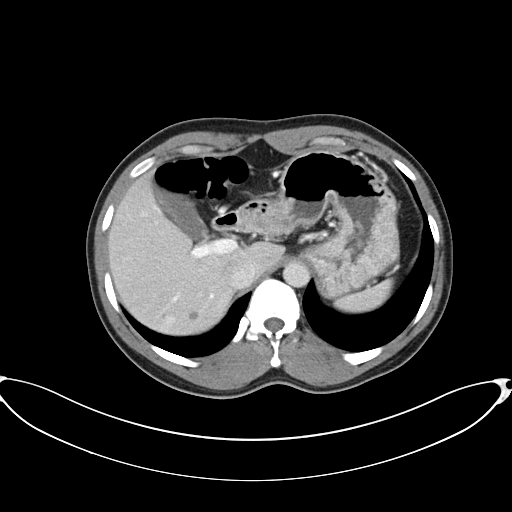
[im 86/101  soft-tissue]
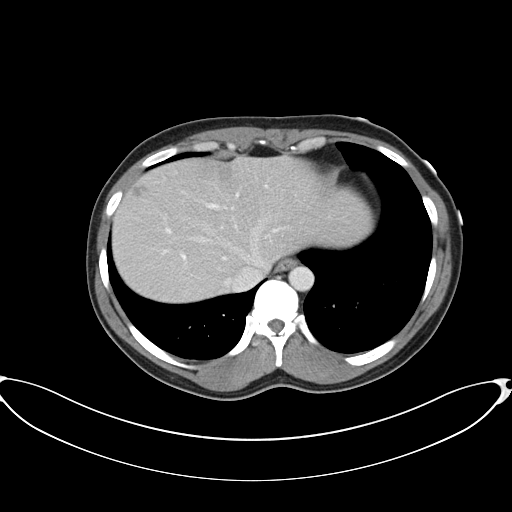
[im 93/101  soft-tissue]
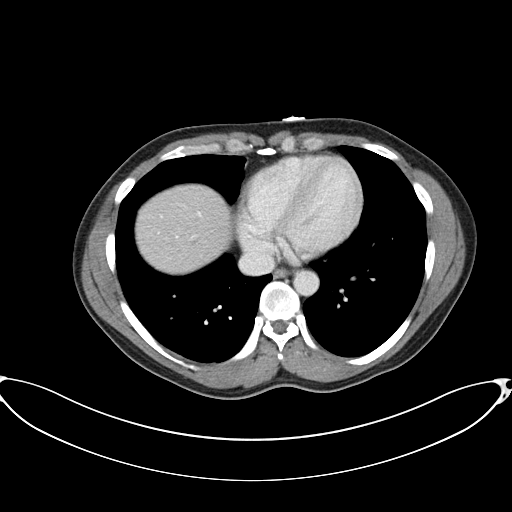

[Series 4: coronal a/|p · coronal · 0.74mm/px · 3 of 108 slices shown]
[im 36/108  soft-tissue]
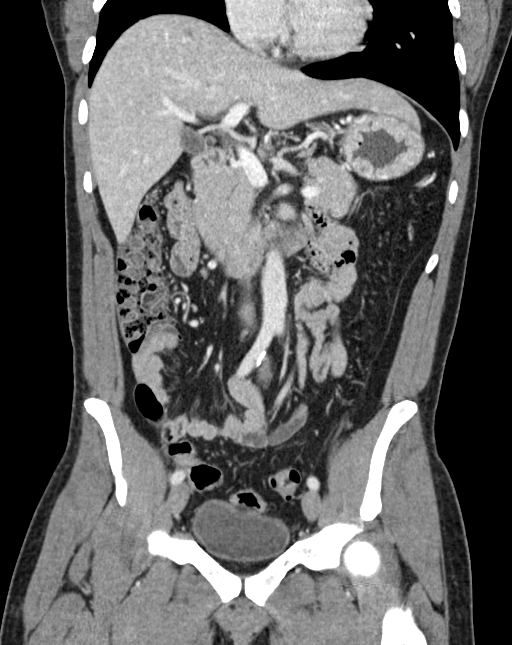
[im 48/108  soft-tissue]
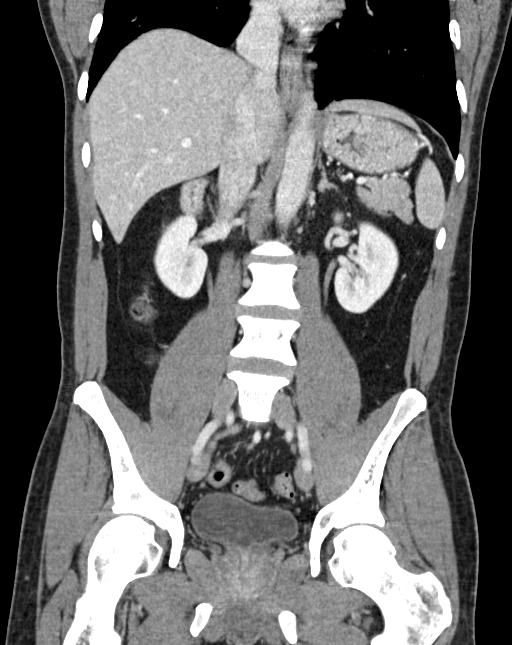
[im 60/108  soft-tissue]
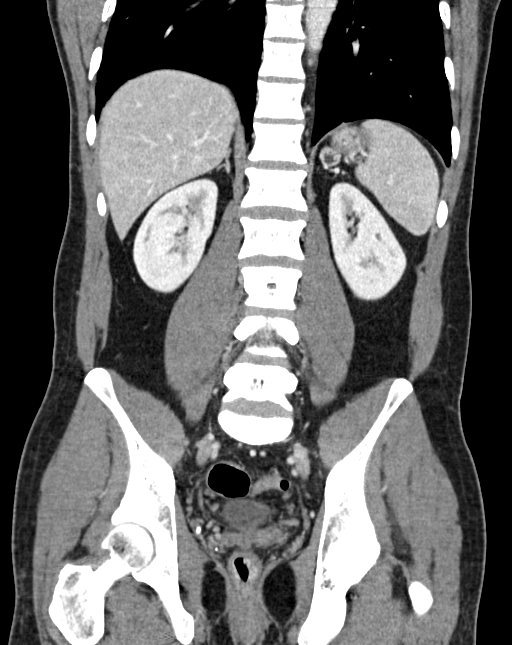

[16 of 46 positions shown; findings below may reference images not displayed]

FINDINGS: Lower chest: Within normal.

Hepatobiliary: Few small liver hypodensities with the largest over
the dome of the liver measuring 1.9 cm likely benign. The larger
hypodensity likely represents a hemangioma as the small
hypodensities may be due to hemangiomas or cysts. Gallbladder and
biliary tree are normal.

Pancreas: Within normal.

Spleen: Within normal.

Adrenals/Urinary Tract: Adrenal glands are normal. Kidneys normal
size without hydronephrosis or nephrolithiasis. Ureters and bladder
are normal.

Stomach/Bowel: There is focal change over the anterior wall of the
gastric antrum which may be within normal although cannot exclude
mild ulceration. Certainly no evidence of perforation. Small
posterior gastric diverticulum. Remaining visualized small bowel is
within normal. Previous appendectomy. Mild colonic diverticulosis.

Vascular/Lymphatic: Within normal.

Reproductive: Within normal.

Other: No free fluid or focal inflammatory change.

Musculoskeletal: Within normal.
IMPRESSION: No acute findings in the abdomen/ pelvis.

Subtle change over the gastric antrum which could be seen with
gastric ulcer. No evidence of perforation. Consider empiric therapy
and follow-up as clinically indicated.

Several small liver hypodensities with the largest over the dome
measuring 1.9 cm is likely benign as this largest lesion likely
represents a hemangioma.

Minimal colonic diverticulosis.

## 2018-12-13 ENCOUNTER — Ambulatory Visit
Admission: EM | Admit: 2018-12-13 | Discharge: 2018-12-13 | Disposition: A | Discharge status: Home / Self Care | Payer: Managed Care, Other (non HMO) | Attending: Physician Assistant | Admitting: Physician Assistant

## 2018-12-13 ENCOUNTER — Encounter: Payer: Self-pay | Admitting: Emergency Medicine

## 2018-12-13 ENCOUNTER — Other Ambulatory Visit: Payer: Self-pay

## 2018-12-13 DIAGNOSIS — R1013 Epigastric pain: Secondary | ICD-10-CM

## 2018-12-13 HISTORY — DX: Gastric ulcer, unspecified as acute or chronic, without hemorrhage or perforation: K25.9

## 2018-12-13 MED ORDER — SUCRALFATE 1 G PO TABS: 1.0000 g | ORAL_TABLET | Freq: Three times a day (TID) | ORAL | 0 refills | Status: AC

## 2018-12-13 MED ORDER — ALUM & MAG HYDROXIDE-SIMETH 200-200-20 MG/5ML PO SUSP
30.0000 mL | Freq: Once | ORAL | Status: AC
  Administered 2018-12-13: 30 mL via ORAL

## 2018-12-13 MED ORDER — LIDOCAINE VISCOUS HCL 2 % MT SOLN
15.0000 mL | Freq: Once | OROMUCOSAL | Status: AC
Start: 2018-12-13 — End: 2018-12-13
  Administered 2018-12-13: 15 mL via ORAL

## 2018-12-13 MED ORDER — PANTOPRAZOLE SODIUM 20 MG PO TBEC: 20.0000 mg | DELAYED_RELEASE_TABLET | Freq: Every day | ORAL | 0 refills | Status: AC

## 2018-12-13 NOTE — ED Provider Notes (Signed)
[facility]    CSN: [id number] Arrival date & time: 12/13/18  1213     History   Chief Complaint Chief Complaint  Patient presents with  . Abdominal Pain    HPI Peter Ballard is a 48 y.o. male.   48 year old male with history of diabetes, PUD, comes in for few day history of epigastric pain.  Patient states he has a history of H. pylori, and developed ulcers as a result.  Last flareup was about 2 years ago.  Recently, started having intermittent epigastric pain causing decrease in appetite.  Denies nausea, vomiting.  Denies diarrhea, melena, hematochezia.  Denies fever, chills, night sweats.  Denies URI symptoms such as cough, congestion, sore throat.  Denies alcohol use, chronic NSAID use.  He does have slightly more stress recently, and wonders if that could have caused it.  He has followed up with GI in the past, but not recently.      Past Medical History:  Diagnosis Date  . Diabetes mellitus without complication ([place])   . Gastric ulcer     There are no active problems to display for this patient.   Past Surgical History:  Procedure Laterality Date  . APPENDECTOMY         Home Medications    Prior to Admission medications   Medication Sig Start Date End Date Taking? Authorizing Provider  acetaminophen (TYLENOL) 500 MG tablet Take 1,000 mg by mouth every 6 (six) hours as needed for mild pain or moderate pain.    [provider]  ibuprofen (ADVIL,MOTRIN) 600 MG tablet Take 1 tablet (600 mg total) by mouth every 6 (six) hours as needed. 04/23/17   Varney Biles, MD  pantoprazole (PROTONIX) 20 MG tablet Take 1 tablet (20 mg total) by mouth daily. 12/13/18   Tasia Catchings,  V, PA-C  sucralfate (CARAFATE) 1 g tablet Take 1 tablet (1 g total) by mouth 4 (four) times daily -  with meals and at bedtime. 12/13/18   Ok Edwards, PA-C    Family History History reviewed. No pertinent family history.  Social History Social History   Tobacco Use  . Smoking  status: Former Research scientist (life sciences)  . Smokeless tobacco: Never Used  Substance Use Topics  . Alcohol use: Yes  . Drug use: No     Allergies   Sulfa antibiotics   Review of Systems Review of Systems  Reason unable to perform ROS: See HPI as above.     Physical Exam Triage Vital Signs ED Triage Vitals  Enc Vitals Group     BP 12/13/18 1235 (!) 124/59     Pulse Rate 12/13/18 1235 82     Resp 12/13/18 1235 18     Temp 12/13/18 1235 98.4 F (36.9 C)     Temp Source 12/13/18 1235 Oral     SpO2 12/13/18 1235 98 %     Weight --      Height --      Head Circumference --      Peak Flow --      Pain Score 12/13/18 1236 7     Pain Loc --      Pain Edu? --      Excl. in [place]? --    No data found.  Updated Vital Signs BP (!) 124/59 (BP Location: Right Arm)   Pulse 82   Temp 98.4 F (36.9 C) (Oral)   Resp 18   SpO2 98%   Visual Acuity Right Eye Distance:  Left Eye Distance:   Bilateral Distance:    Right Eye Near:   Left Eye Near:    Bilateral Near:     Physical Exam Constitutional:      General: He is not in acute distress.    Appearance: He is well-developed.  HENT:     Head: Normocephalic and atraumatic.  Cardiovascular:     Rate and Rhythm: Normal rate and regular rhythm.     Heart sounds: Normal heart sounds. No murmur. No friction rub. No gallop.   Pulmonary:     Effort: Pulmonary effort is normal.     Breath sounds: Normal breath sounds. No wheezing or rales.  Abdominal:     General: Bowel sounds are normal.     Palpations: Abdomen is soft.     Tenderness: There is no abdominal tenderness. There is no right CVA tenderness, left CVA tenderness, guarding or rebound.  Skin:    General: Skin is warm and dry.  Neurological:     Mental Status: He is alert and oriented to person, place, and time.  Psychiatric:        Behavior: Behavior normal.        Judgment: Judgment normal.      UC Treatments / Results  Labs (all labs ordered are listed, but only abnormal  results are displayed) Labs Reviewed - No data to display  EKG   Radiology No results found.  Procedures Procedures (including critical care time)  Medications Ordered in UC Medications  alum & mag hydroxide-simeth (MAALOX/MYLANTA) 200-200-20 MG/5ML suspension 30 mL (30 mLs Oral Given 12/13/18 1258)    And  lidocaine (XYLOCAINE) 2 % viscous mouth solution 15 mL (15 mLs Oral Given 12/13/18 1258)    Initial Impression / Assessment and Plan / UC Course  I have reviewed the triage vital signs and the nursing notes.  Pertinent labs & imaging results that were available during my care of the patient were reviewed by me and considered in my medical decision making (see chart for details).    Will provide GI cocktail in office today.  Refill Carafate, Protonix.  Push fluids.  Avoid NSAIDs, alcohol as directed.  Otherwise follow-up with GI for further evaluation and management if symptoms not improving.  Return precautions given.  Patient expresses understanding and agrees to plan.  Final Clinical Impressions(s) / UC Diagnoses   Final diagnoses:  Epigastric pain    ED Prescriptions    Medication Sig Dispense Auth. Provider   pantoprazole (PROTONIX) 20 MG tablet Take 1 tablet (20 mg total) by mouth daily. 30 tablet ,  V, PA-C   sucralfate (CARAFATE) 1 g tablet Take 1 tablet (1 g total) by mouth 4 (four) times daily -  with meals and at bedtime. 40 tablet Threasa Alpha,  V, PA-C       ,  V, [place] 12/13/18 1304

## 2018-12-13 NOTE — ED Triage Notes (Signed)
Pt here for abd pain from ulcers per pt; pt sts out of meds

## 2018-12-13 NOTE — Discharge Instructions (Signed)
GI cocktail given. Start protonix and carafate as directed. Keep hydrated, urine should be clear to pale yellow in color.  Follow up with GI if symptoms not improving.

## 2019-03-30 ENCOUNTER — Encounter (HOSPITAL_COMMUNITY): Payer: Self-pay | Admitting: Emergency Medicine

## 2019-03-30 ENCOUNTER — Emergency Department (HOSPITAL_COMMUNITY)
Admission: EM | Admit: 2019-03-30 | Discharge: 2019-03-30 | Disposition: A | Discharge status: Home / Self Care | Payer: Self-pay | Attending: Emergency Medicine | Admitting: Emergency Medicine

## 2019-03-30 ENCOUNTER — Emergency Department (HOSPITAL_COMMUNITY): Payer: Self-pay

## 2019-03-30 ENCOUNTER — Other Ambulatory Visit: Payer: Self-pay

## 2019-03-30 DIAGNOSIS — E119 Type 2 diabetes mellitus without complications: Secondary | ICD-10-CM | POA: Insufficient documentation

## 2019-03-30 DIAGNOSIS — Z87891 Personal history of nicotine dependence: Secondary | ICD-10-CM | POA: Insufficient documentation

## 2019-03-30 DIAGNOSIS — Z20828 Contact with and (suspected) exposure to other viral communicable diseases: Secondary | ICD-10-CM | POA: Insufficient documentation

## 2019-03-30 DIAGNOSIS — J069 Acute upper respiratory infection, unspecified: Secondary | ICD-10-CM | POA: Insufficient documentation

## 2019-03-30 DIAGNOSIS — R55 Syncope and collapse: Secondary | ICD-10-CM | POA: Insufficient documentation

## 2019-03-30 DIAGNOSIS — R079 Chest pain, unspecified: Secondary | ICD-10-CM

## 2019-03-30 DIAGNOSIS — Z79899 Other long term (current) drug therapy: Secondary | ICD-10-CM | POA: Insufficient documentation

## 2019-03-30 LAB — CBC
HCT: 44.7 % (ref 39.0–52.0)
Hemoglobin: 14.7 g/dL (ref 13.0–17.0)
MCH: 30.4 pg (ref 26.0–34.0)
MCHC: 32.9 g/dL (ref 30.0–36.0)
MCV: 92.5 fL (ref 80.0–100.0)
Platelets: 210 10*3/uL (ref 150–400)
RBC: 4.83 MIL/uL (ref 4.22–5.81)
RDW: 11.9 % (ref 11.5–15.5)
WBC: 6.2 10*3/uL (ref 4.0–10.5)
nRBC: 0 % (ref 0.0–0.2)

## 2019-03-30 LAB — BASIC METABOLIC PANEL
Anion gap: 8 (ref 5–15)
BUN: 17 mg/dL (ref 6–20)
CO2: 25 mmol/L (ref 22–32)
Calcium: 9 mg/dL (ref 8.9–10.3)
Chloride: 104 mmol/L (ref 98–111)
Creatinine, Ser: 1.12 mg/dL (ref 0.61–1.24)
GFR calc Af Amer: >60 mL/min (ref >60)
GFR calc non Af Amer: >60 mL/min (ref >60)
Glucose, Bld: 103 mg/dL — ABNORMAL HIGH (ref 70–99)
Potassium: 4 mmol/L (ref 3.5–5.1)
Sodium: 137 mmol/L (ref 135–145)

## 2019-03-30 LAB — TROPONIN I (HIGH SENSITIVITY): Troponin I (High Sensitivity): <2 ng/L (ref <18)

## 2019-03-30 MED ORDER — SODIUM CHLORIDE 0.9% FLUSH: 3.0000 mL | Freq: Once | INTRAVENOUS | Status: DC

## 2019-03-30 NOTE — ED Triage Notes (Signed)
Patient is complaining of left chest pain for a week. Patient passed out on Saturday at the barber shop. Patient states coffee is making his chest pain feel a little bit better for a little while.

## 2019-03-30 NOTE — Discharge Instructions (Addendum)
Please call your primary care doctor to schedule an appointment for recheck, ideally later this week to discuss the symptoms you are experiencing today and discuss if any further testing is needed as an outpatient.  Please return to ER if you had any episodes of passing out, worsening chest pain, difficulty breathing, or other new concerning symptom.  Recommend following isolation precautions until we have the results of your coronavirus test.

## 2019-03-30 NOTE — ED Notes (Signed)
Pt requests a COVID test.

## 2019-03-30 NOTE — ED Provider Notes (Signed)
[facility]-EMERGENCY DEPT Provider Note   CSN: [id number] Arrival date & time: 03/30/19  0534     History   Chief Complaint Chief Complaint  Patient presents with  . Chest Pain    HPI Peter Ballard is a 48 y.o. male.  Presents to the emergency department with chief complaint of cough, chest pain.  Patient states over the past week he has been having steadily worsening cough, states cough is intermittently productive, nonbloody, occasional yellow/green sputum.  Patient states chest pain associated with severe coughing spells.  Currently not experiencing any chest pain.  States chest pain seems to be worse in the morning after having heavy coughing spell, but improved after he is able to expectorate some sputum and drinks of coffee.  This pain is not associated with exertion.  No associated shortness of breath.  On review of systems, patient reports he had a syncopal episode on Saturday.  This occurred while at the barbershop.  States that he felt somewhat lightheaded, and passed out.  States he was helped back to the chair by bystander.  No bladder or bowel incontinence, no tongue or lip injury, no shaking.  Patient had no post episode confusion.  No similar symptoms since that time.  Says he has been around many people, and has not been social distancing and thinks he may have been exposed to COVID-19.     HPI  Past Medical History:  Diagnosis Date  . Diabetes mellitus without complication ([facility])   . Gastric ulcer     There are no active problems to display for this patient.   Past Surgical History:  Procedure Laterality Date  . APPENDECTOMY          Home Medications    Prior to Admission medications   Medication Sig Start Date End Date Taking? Authorizing Provider  acetaminophen (TYLENOL) 500 MG tablet Take 1,000 mg by mouth every 6 (six) hours as needed for mild pain or moderate pain.    [provider]  ibuprofen (ADVIL,MOTRIN) 600 MG  tablet Take 1 tablet (600 mg total) by mouth every 6 (six) hours as needed. 04/23/17   Derwood Kaplan, MD  pantoprazole (PROTONIX) 20 MG tablet Take 1 tablet (20 mg total) by mouth daily. 12/13/18   Cathie Hoops, Amy V, PA-C  sucralfate (CARAFATE) 1 g tablet Take 1 tablet (1 g total) by mouth 4 (four) times daily -  with meals and at bedtime. 12/13/18   Belinda Fisher, PA-C    Family History History reviewed. No pertinent family history.  Social History Social History   Tobacco Use  . Smoking status: Former Games developer  . Smokeless tobacco: Never Used  Substance Use Topics  . Alcohol use: Yes  . Drug use: No     Allergies   Sulfa antibiotics   Review of Systems Review of Systems  Constitutional: Negative for chills and fever.  HENT: Negative for ear pain and sore throat.   Eyes: Negative for pain and visual disturbance.  Respiratory: Positive for cough. Negative for shortness of breath.   Cardiovascular: Positive for chest pain. Negative for palpitations.  Gastrointestinal: Negative for abdominal pain and vomiting.  Genitourinary: Negative for dysuria and hematuria.  Musculoskeletal: Negative for arthralgias and back pain.  Skin: Negative for color change and rash.  Neurological: Negative for seizures and syncope.  All other systems reviewed and are negative.    Physical Exam Updated Vital Signs BP (!) 145/103   Pulse (!) 58   Temp 98.5  F (36.9 C) (Oral)   Resp 14   Ht 6' (1.829 m)   Wt 77.1 kg   SpO2 99%   BMI 23.06 kg/m   Physical Exam Vitals signs and nursing note reviewed.  Constitutional:      Appearance: He is well-developed.  HENT:     Head: Normocephalic and atraumatic.  Eyes:     Conjunctiva/sclera: Conjunctivae normal.  Neck:     Musculoskeletal: Neck supple.  Cardiovascular:     Rate and Rhythm: Normal rate and regular rhythm.     Heart sounds: No murmur.  Pulmonary:     Effort: Pulmonary effort is normal. No respiratory distress.     Breath sounds: Normal  breath sounds.  Abdominal:     Palpations: Abdomen is soft.     Tenderness: There is no abdominal tenderness.  Musculoskeletal:     Right lower leg: No edema.     Left lower leg: No edema.  Skin:    General: Skin is warm and dry.     Capillary Refill: Capillary refill takes less than 2 seconds.  Neurological:     General: No focal deficit present.     Mental Status: He is alert and oriented to person, place, and time.     Cranial Nerves: No cranial nerve deficit.     Motor: No weakness.      ED Treatments / Results  Labs (all labs ordered are listed, but only abnormal results are displayed) Labs Reviewed  BASIC METABOLIC PANEL - Abnormal; Notable for the following components:      Result Value   Glucose, Bld 103 (*)    All other components within normal limits  NOVEL CORONAVIRUS, NAA (HOSP ORDER, SEND-OUT TO REF LAB; TAT 18-24 HRS)  CBC  TROPONIN I (HIGH SENSITIVITY)    EKG EKG Interpretation  Date/Time:  Wednesday March 30 2019 06:03:00 EST Ventricular Rate:  85 PR Interval:    QRS Duration: 88 QT Interval:  371 QTC Calculation: 442 R Axis:   86 Text Interpretation: Sinus rhythm Borderline T wave abnormalities No change when compared to prior Confirmed by Marianna Fussykstra, Neo Yepiz ([id number]) on 03/30/2019 7:16:18 AM   Radiology Dg Chest 2 View  Result Date: 03/30/2019 CLINICAL DATA:  Chest pain, dizziness and shortness of breath. EXAM: CHEST - 2 VIEW COMPARISON:  10/19/2016 FINDINGS: The heart size and mediastinal contours are within normal limits. Both lungs are clear. Moderate hyperinflation. No worrisome pulmonary lesions. The visualized skeletal structures are unremarkable. IMPRESSION: Hyperinflation but no infiltrates, effusions or edema. Electronically Signed   By: Rudie MeyerP.  Gallerani M.D.   On: 03/30/2019 06:33    Procedures Procedures (including critical care time)  Medications Ordered in ED Medications - No data to display   Initial Impression / Assessment and Plan  / ED Course  I have reviewed the triage vital signs and the nursing notes.  Pertinent labs & imaging results that were available during my care of the patient were reviewed by me and considered in my medical decision making (see chart for details).        48 year old otherwise healthy male presents to ER with cough, chest pain.  On exam noted be well-appearing with normal vital signs.  His chest x-ray is negative for pneumonia.  EKG without ischemic changes, troponin within normal limits.  Doubt ACS given this work-up and his description of symptoms.  Suspect chest discomfort related to frequent coughing.  Suspect viral URI.  Will check for COVID-19 and recommend isolation precautions until  this results.  Believe appropriate for outpatient management and dc at this time.  After the discussed management above, the patient was determined to be safe for discharge.  The patient was in agreement with this plan and all questions regarding their care were answered.  ED return precautions were discussed and the patient will return to the ED with any significant worsening of condition.    Final Clinical Impressions(s) / ED Diagnoses   Final diagnoses:  Viral upper respiratory illness  Chest pain, unspecified type    ED Discharge Orders    None       Lucrezia Starch, MD 03/30/19 [phone]

## 2019-04-01 LAB — NOVEL CORONAVIRUS, NAA (HOSP ORDER, SEND-OUT TO REF LAB; TAT 18-24 HRS): SARS-CoV-2, NAA: NOT DETECTED
